# Patient Record
Sex: Female | Born: 1940 | Race: Black or African American | Hispanic: No | Marital: Single | Smoking: Never smoker
Health system: Southern US, Community
[De-identification: ages and names within clinical notes are randomized; demographics above are authoritative.]

---

## 1998-11-23 ENCOUNTER — Encounter: Payer: Self-pay | Admitting: Neurosurgery

## 1998-11-23 ENCOUNTER — Ambulatory Visit (HOSPITAL_COMMUNITY): Admission: RE | Admit: 1998-11-23 | Discharge: 1998-11-23 | Payer: Self-pay | Admitting: Neurosurgery

## 1998-12-05 ENCOUNTER — Encounter: Payer: Self-pay | Admitting: Neurosurgery

## 1998-12-05 ENCOUNTER — Ambulatory Visit (HOSPITAL_COMMUNITY): Admission: RE | Admit: 1998-12-05 | Discharge: 1998-12-05 | Payer: Self-pay | Admitting: Neurosurgery

## 1998-12-22 ENCOUNTER — Encounter: Payer: Self-pay | Admitting: Neurosurgery

## 1998-12-27 ENCOUNTER — Encounter: Payer: Self-pay | Admitting: Neurosurgery

## 1998-12-27 ENCOUNTER — Inpatient Hospital Stay (HOSPITAL_COMMUNITY): Admission: RE | Admit: 1998-12-27 | Discharge: 1999-01-04 | Payer: Self-pay | Admitting: Neurosurgery

## 1999-07-05 ENCOUNTER — Encounter: Admission: RE | Admit: 1999-07-05 | Discharge: 1999-07-05 | Payer: Self-pay | Admitting: Neurosurgery

## 1999-07-05 ENCOUNTER — Encounter: Payer: Self-pay | Admitting: Neurosurgery

## 1999-07-27 ENCOUNTER — Ambulatory Visit (HOSPITAL_COMMUNITY): Admission: RE | Admit: 1999-07-27 | Discharge: 1999-07-27 | Payer: Self-pay | Admitting: Neurosurgery

## 1999-07-27 ENCOUNTER — Encounter: Payer: Self-pay | Admitting: Neurosurgery

## 1999-10-05 ENCOUNTER — Ambulatory Visit (HOSPITAL_COMMUNITY): Admission: RE | Admit: 1999-10-05 | Discharge: 1999-10-05 | Payer: Self-pay | Admitting: Neurosurgery

## 1999-10-05 ENCOUNTER — Encounter: Payer: Self-pay | Admitting: Neurosurgery

## 1999-11-28 ENCOUNTER — Encounter: Payer: Self-pay | Admitting: Neurosurgery

## 1999-11-28 ENCOUNTER — Inpatient Hospital Stay (HOSPITAL_COMMUNITY): Admission: RE | Admit: 1999-11-28 | Discharge: 1999-12-04 | Payer: Self-pay | Admitting: Neurosurgery

## 2000-01-18 ENCOUNTER — Encounter: Admission: RE | Admit: 2000-01-18 | Discharge: 2000-01-18 | Payer: Self-pay | Admitting: Neurosurgery

## 2000-01-18 ENCOUNTER — Encounter: Payer: Self-pay | Admitting: Neurosurgery

## 2002-07-04 ENCOUNTER — Ambulatory Visit (HOSPITAL_COMMUNITY): Admission: RE | Admit: 2002-07-04 | Discharge: 2002-07-04 | Payer: Self-pay | Admitting: Neurosurgery

## 2002-07-04 ENCOUNTER — Encounter: Payer: Self-pay | Admitting: Neurosurgery

## 2003-10-12 ENCOUNTER — Ambulatory Visit (HOSPITAL_BASED_OUTPATIENT_CLINIC_OR_DEPARTMENT_OTHER): Admission: RE | Admit: 2003-10-12 | Discharge: 2003-10-12 | Payer: Self-pay | Admitting: Orthopaedic Surgery

## 2003-10-12 ENCOUNTER — Ambulatory Visit (HOSPITAL_COMMUNITY): Admission: RE | Admit: 2003-10-12 | Discharge: 2003-10-12 | Payer: Self-pay | Admitting: Orthopaedic Surgery

## 2004-05-22 ENCOUNTER — Ambulatory Visit (HOSPITAL_COMMUNITY): Admission: RE | Admit: 2004-05-22 | Discharge: 2004-05-22 | Payer: Self-pay | Admitting: Neurosurgery

## 2005-08-06 ENCOUNTER — Ambulatory Visit (HOSPITAL_COMMUNITY): Admission: RE | Admit: 2005-08-06 | Discharge: 2005-08-06 | Payer: Self-pay | Admitting: Neurosurgery

## 2005-09-20 ENCOUNTER — Ambulatory Visit: Admission: RE | Admit: 2005-09-20 | Discharge: 2005-09-20 | Payer: Self-pay | Admitting: Neurosurgery

## 2005-10-09 ENCOUNTER — Inpatient Hospital Stay (HOSPITAL_COMMUNITY): Admission: RE | Admit: 2005-10-09 | Discharge: 2005-10-13 | Payer: Self-pay | Admitting: Neurosurgery

## 2008-04-19 ENCOUNTER — Encounter: Admission: RE | Admit: 2008-04-19 | Discharge: 2008-04-19 | Payer: Self-pay | Admitting: Neurosurgery

## 2010-04-19 IMAGING — CT CT L SPINE W/O CM
4 of 10 series · 12 of 33 positions shown, 14 images · non-contrast
Comparison: Intraoperative films 10/09/2005 and preoperative MRI of
the lumbar spine 08/06/2005.

CLINICAL DATA: Status post L2-S1 fusion.  Increased left leg pain.
Multiple prior surgeries

CT LUMBAR SPINE WITHOUT CONTRAST
TECHNIQUE: Multidetector CT imaging of the lumbar spine was
performed without intravenous contrast administration. Multiplanar
CT image reconstructions were also generated.

[Series 2: l-spine helical · axial · 0.27mm/px · z∈[-14,+54]mm · 2 of 82 slices shown, 3 images]
[im 28/82  soft-tissue]
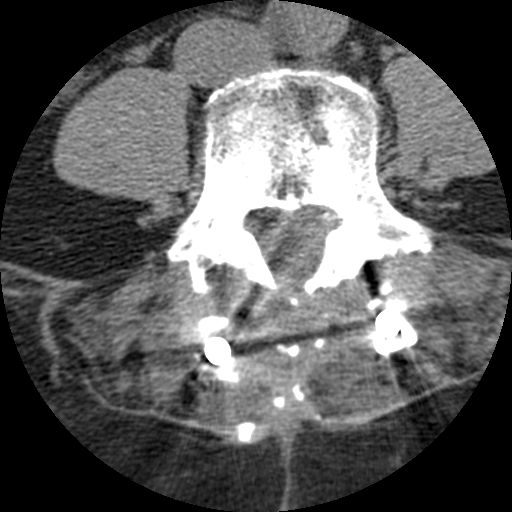
[im 28/82  bone]
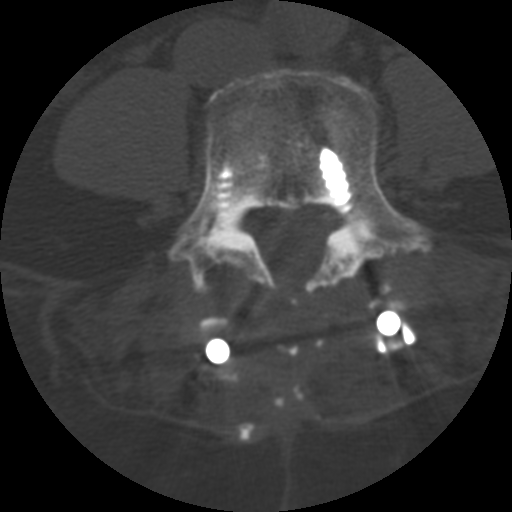
[im 55/82  bone]
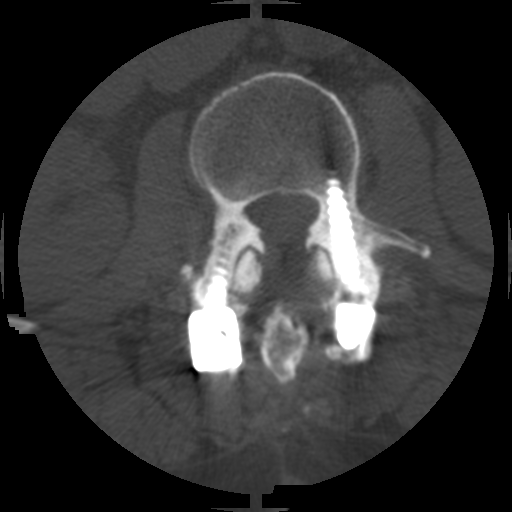

[Series 3: bone windows · axial · 0.27mm/px · z∈[-14,+54]mm · 2 of 82 slices shown]
[im 28/82  bone]
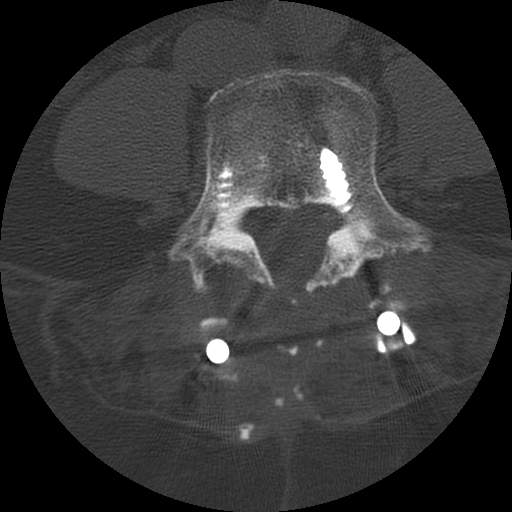
[im 55/82  bone]
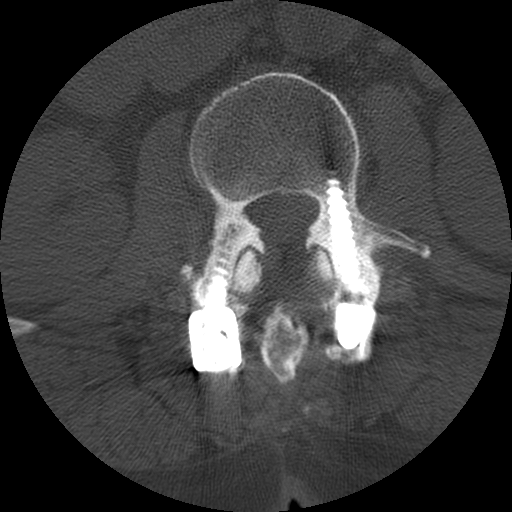

[Series 400: sag · sagittal · 0.41mm/px · 5 of 34 slices shown, 6 images]
[im 12/34  bone]
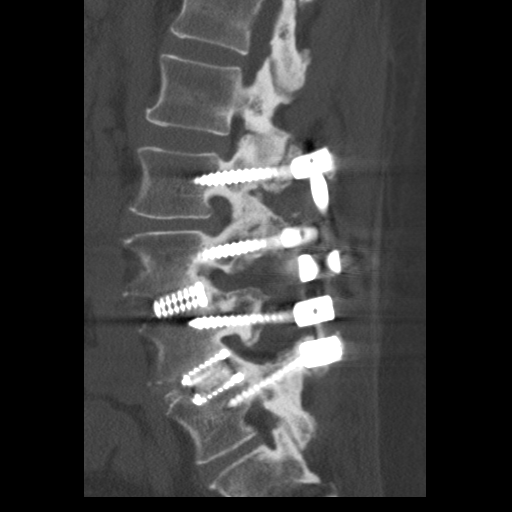
[im 14/34  bone]
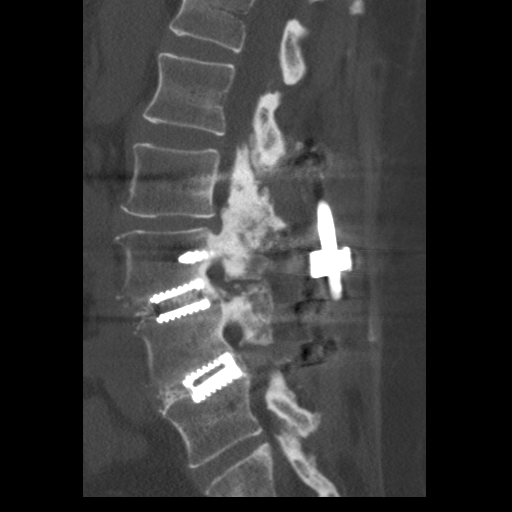
[im 17/34  soft-tissue]
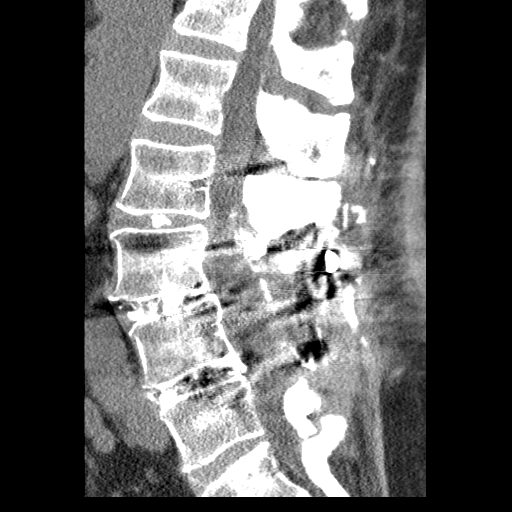
[im 17/34  bone]
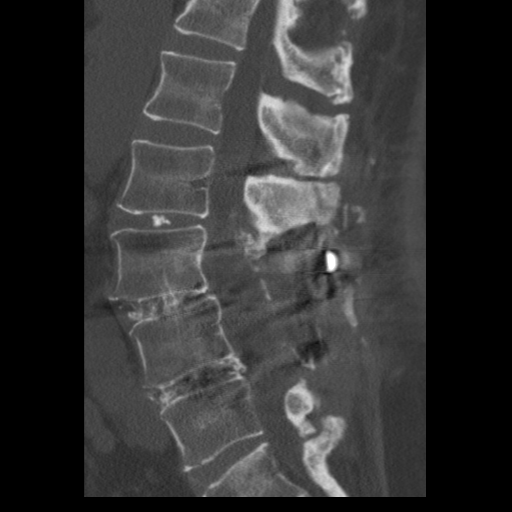
[im 20/34  bone]
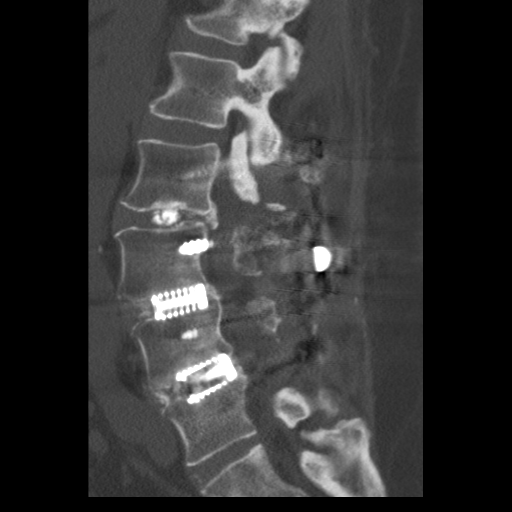
[im 23/34  bone]
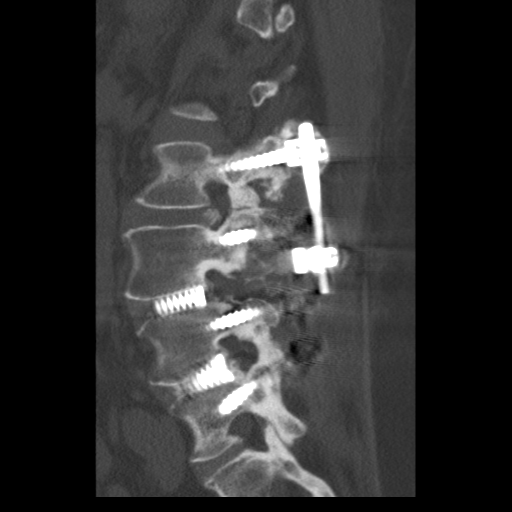

[Series 401: cor · coronal · 0.41mm/px · 3 of 34 slices shown]
[im 7/34  bone]
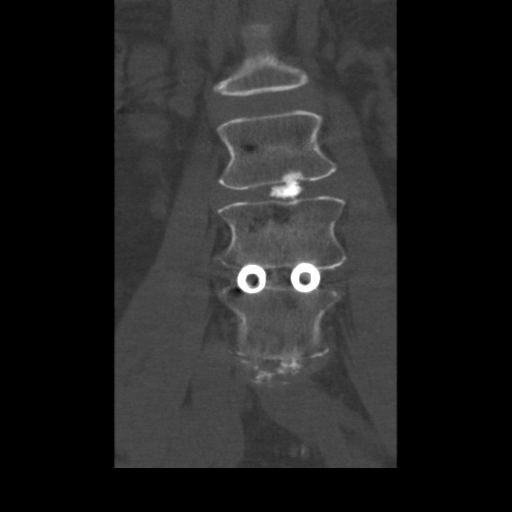
[im 14/34  bone]
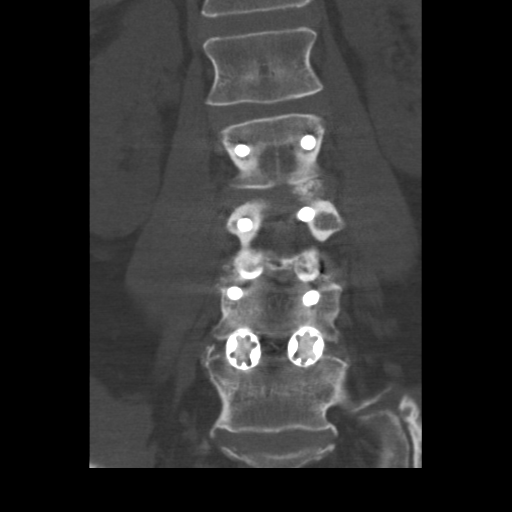
[im 20/34  bone]
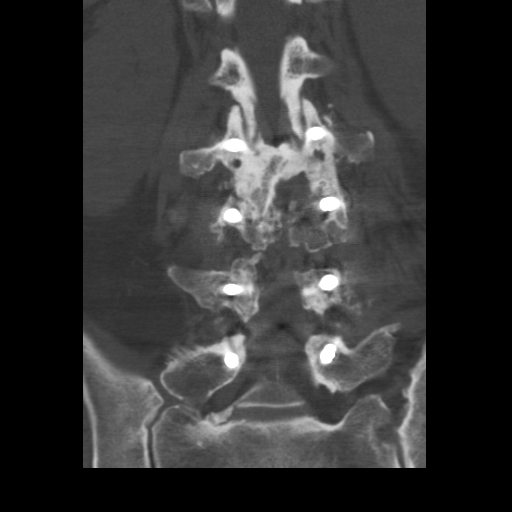

[12 of 33 positions shown; findings below may reference images not displayed]

FINDINGS: The lumbar spine is imaged from T12-S1.  Previous
laminectomy and PLIF L3-L5 with ray cages is redemonstrated.  There
is mature osseous fusion at the L3-4 and L4-5 levels.  There is
been interval extension of the fusion include L2-3.  A separate
pedicle screw and rod fixation is evident at the L2 level.  This is
then attached to the preexisting hardware from L3-L5.  The left
pedicle screw at L3 traverses the left lateral recess.  This could
potentially effect the L3 nerve root before it enters the foramen
or even the L4 nerve root.  Additionally, there is graft material
which extends posteriorly from the disc space on the left into the
epidural space and into the left neural foramen.  This creates
potential mass effect on the left L2 or L3 nerve roots.  No
definite incorporation of graft material is seen at the L2-3 level
within the disc space.  The L2-3 facet joints do appear fused.  A
left hemilaminotomy has been performed.  The right foramen is
patent.

Mild facet hypertrophy is present at L1-2 without significant
stenosis.  The T12-L1 level is unremarkable.

L2-3 is as described.

L3-4:  Residual facets create some encroachment posterior aspect of
the canal but the canal and foramina overall appear to be patent.

L4-5:  Laminectomy has been performed.  There is no focal stenosis.

L5-S1:  Mild facet hypertrophy is present.  There is no significant
stenosis.
IMPRESSION: 1.  Extension of fusion to include the L2-3 level.
2.  The graft material L2-3 is not incorporated into the bone.
3.  The posterior elements at L2-3 are fused.
4.  There is extrusion of graft material into the epidural space on
the left with potential mass effect on the L3 nerve root within the
lateral recess or the exiting L2 nerve root.  The
5.  Encroachment upon the lateral recess by the left pedicle screw
6.  Otherwise stable fusion L3-L5.

## 2010-08-05 ENCOUNTER — Encounter: Payer: Self-pay | Admitting: Neurosurgery

## 2010-12-01 NOTE — H&P (Signed)
[place]  Patient:    Elizabeth Madden, Elizabeth Madden                      MRN: [id number] Adm. Date:  11/28/99 Attending:  Payton Doughty, M.D.                         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  HISTORY OF PRESENT ILLNESS:  The patient is a 70 year old right handed black female who has undergone anterior decompression and fusion in 1993 and 1995 and underwent a decompressive laminectomy on September 17, 1994.  In November 1999, she was attacked by a dog and got knocked down.  She had increasing pain in her back.  This showed a bit of slip at L3-4 and degenerative changes at L2-3 and L4-5.  discography was positive at L3-4 and L4-5 and she underwent a Ray cage fusion.  She did well for several months, then had progressive increase in back pain.  Injection of the cages has demonstrated filling suggestive of nonunion.  She is admitted for augmentation of her fusion with pedicle fixation.  PAST MEDICAL HISTORY:  Remarkable for hysterectomy in 1977.  MEDICATIONS:  Xanax and Elavil.  SOCIAL HISTORY:  She does not smoke or drink.  She is on disability.  FAMILY HISTORY:  Noncontributory.  REVIEW OF SYSTEMS:  Unremarkable for bladder dysfunction.  PHYSICAL EXAMINATION:  HEENT:  Within normal limits.  NECK:  Good range of motion.  CHEST:  Clear.  CARDIAC:  Regular rate and rhythm.  ABDOMEN:  Nontender.  No hepatosplenomegaly.  EXTREMITIES:  Without clubbing or cyanosis.  GENITOURINARY:  Deferred.  PERIPHERAL PULSES:  Good.  NEUROLOGIC:  She is awake, alert and oriented.  Cranial nerves II-XII intact. Motor exam shows 5/5 strength throughout the upper and lower extremities save for dorsiflexors on the right side.  Knee jerks are diminished on the left. Ankle jerks are 1 bilaterally.  Straight leg raise is positive.  Forward flexion and extension of her back causes her to have back pain.  LABORATORY DATA:   Radiographic studies have been reviewed as above.  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  She is admitted now for augmentation with pedicle fixation.  The risks and benefits have been discussed with her and she wishes to proceed. DD:  11/28/99 TD:  11/28/99 Job: [id number]

## 2010-12-01 NOTE — Op Note (Signed)
[place]  Patient:    Elizabeth Madden, Elizabeth Madden                    MRN: [id number] Proc. Date: 11/28/99 Adm. 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  OPERATION PERFORMED:  L3-4, L4-5 pedicle fixation with spiral 90 system.  SURGEON:  Payton Doughty, M.D.  ANESTHESIA:  General endotracheal.  PREP:  Sterile Betadine prep and scrub with alcohol wipe.  COMPLICATIONS:  None.  DESCRIPTION OF PROCEDURE:  The patient is a 70 year old right-handed black female with pseudarthrosis after interbody fusion a year ago at 3-4 and 4-5. The patient was taken to the operating room, smoothly anesthetized and intubated, and placed prone on the operating table.  Following shave, prep and drape in the usual sterile fashion the old skin incision was infiltrated with 1% lidocaine with 1:400,000 epinephrine.  The skin incision was reopened from the top of L2 to the mid-S1.  The lamina of L2, L3, L4 and L5 were exposed bilaterally in the subperiosteal plane.  The transverse processes of L3, L4 and L5 were exposed and using these as landmarks, the pedicles of L3, L4 and L5 were identified.  The transverse processes were uncovered and decorticated and prepared for bone grafting.  Using standard landmarks then, pedicle screws were placed first on the right side, then on the left side at L5, L4 and L3. Intraoperative x-ray showed good placement of pedicle screws.  Rods were then placed across the pedicle screws and locked into place.  Intraoperative x-ray showed good placement of screws and rods.  The transverse processes were then covered with Osteoblast BMP with bone chips.  The entire area was irrigated, hemostasis assured, the fascia was reapproximated with 0 Vicryl in interrupted fashion and subcutaneous  tissues reapproximated with 0 Vicryl in interrupted fashion.  The subcuticular tissues were reapproximated with 3-0 Vicryl in interrupted fashion.  The skin was closed with 3-0 nylon in a running locked fashion.  Betadine Telfa dressing was applied and made occlusive with Op-Site. The patient then returned to the recovery room in good position. DD:  11/28/99 TD:  11/30/99 Job: [id number]

## 2010-12-01 NOTE — Op Note (Signed)
NAME:  Elizabeth Madden, Elizabeth Madden                       ACCOUNT NO.:  [id number]   MEDICAL RECORD NO.:  [id number]                   PATIENT TYPE:  AMB   LOCATION:  DSC                                  FACILITY:  [facility]   PHYSICIAN:  Claude Manges. Cleophas Dunker, M.D.            DATE OF BIRTH:  1940-10-15   DATE OF PROCEDURE:  10/12/2003  DATE OF DISCHARGE:                                 OPERATIVE REPORT   PREOPERATIVE DIAGNOSIS:  Impingement right shoulder with degenerative joint  disease acromioclavicular joint.   POSTOPERATIVE DIAGNOSIS:  Impingement right shoulder with degenerative joint  disease acromioclavicular joint with partial rotator cuff tear.   PROCEDURE:  1. Arthroscopic debridement right shoulder joint.  2. Arthroscopic subacromial decompression.  3. Open distal clavicle resection.   SURGEON:  Claude Manges. Cleophas Dunker, M.D.   ASSISTANT:  Richardean Canal, P.A.-C   ANESTHESIA:  General endotracheal anesthesia with supplemental interscalene  nerve block.   COMPLICATIONS:  None.   HISTORY:  This 70 year old female has been followed since January 2004 for  problems referable to her right shoulder.  She has had an MRI revealing  partial rotator cuff tear with some degenerative changes at the [facility] joint.  She has had approximately 4-5 cortisone injections with temporary relief of  her pain. She does have clinical evidence of impingement, but without  evidence of adhesive capsulitis.  She is now to have an arthroscopic  evaluation.   DESCRIPTION OF PROCEDURE:  With the patient comfortable on the operating  room table under general endotracheal anesthesia the patient was placed in a  semi-sitting position.  She did have a preoperative interscalene nerve block  for __________ pain control.  With the patient in a semi-sitting position  the right shoulder was then prepped with duraprep and the patient had  __________ below the elbow, sterile draping was performed.   A marking pen was used to  outline the [facility] joint, the coracoid and the  acromion.  At a point about a fingerbreadth posterior and inferior to the  posterior angle of the acromion a small stab wound was made.  The  arthroscope was then easily placed into the shoulder joint.  Diagnostic  arthroscopy did reveal a partial rotator cuff tear of the supraspinatus.  A  second portal was established anteriorly and debridement of that was  performed.   There was moderate synovitis of the joint which was debrided with the  ArthroCare 1 though I did not see any appreciable chondromalacia of the  humeral head or glenoid.  There were no loose bodies.  The biceps tendon  appeared to be intact as did the anterior glenoid labrum.   The arthroscope was then placed in the subacromial space posteriorly.  The  third portal was established in the lateral subacromial space and an  arthroscopic subacromial decompression was performed.  There was obvious  overhang and thickening of the acromion; had a very nice decompression with  the  6-mm bur such at that there was no further impingement.  I did not see  any evidence of rotator cuff tear.   Distal clavicle resection was performed through a 1-inch incision; about  approximately 1-cm of the distal clavicle was resected.  With finger  exploration, I felt that I had an excellent decompression of both the  anterior acromion and the distal clavicle.  The wound was irrigated with  saline solution.  The [facility] joint capsule was closed with interrupted #0  Vicryl; the subcu with 2-0 Vicryl; skin closed with skin clips.  Sterile  bulky dressing was applied.   The patient tolerated the procedure without complications.   PLAN:  Possible recovery care otherwise discharge on Percocet.                                               Claude Manges. Cleophas Dunker, M.D.    PWW/MEDQ  D:  10/12/2003  T:  10/12/2003  Job:  [id number]

## 2010-12-01 NOTE — Discharge Summary (Signed)
[place]. [facility]  Patient:    Elizabeth Madden, Elizabeth Madden                    MRN: [id number] Adm. Date:  [id number] Disc. Date: [id number] Attending:  Emeterio Reeve                           Discharge Summary  ADMITTING DIAGNOSIS:  Nonunion at L3-4, L4-5.  DISCHARGE DIAGNOSIS:  Nonunion at L3-4, L4-5.  OPERATIVE PROCEDURE:  L3-4, L4-5 fusion and augmentation with pedicle screws.  SERVICE:  Neurosurgery.  COMPLICATIONS:  None.  DISCHARGE STATUS:  Alive and well.  HISTORY:  This is a 70 year old right-handed black lady whose history and physical is recounted in the chart.  She had undergone a fusion a year ago at 3-4 and 4-5 and developed a nonunion.  She was admitted for fixation with pedicle screws.  MEDICAL HISTORY:  Remarkable for hysterectomy in 1977.  MEDICATIONS:  Xanax, Elavil and Percocet.  PHYSICAL EXAMINATION:  General exam was unremarkable save for back pain with motion.  Neurologic exam was intact.  HOSPITAL COURSE:  She was admitted after ascertainment of normal laboratory values and underwent pedicle fixation at L3-4, L4-5.  Postoperatively, she has done well, somewhat slow to mobilize but is up and about now and participating in physical therapy.  Foley was removed after the second postoperative day. PCA was stopped after the third postoperative day.  With physical therapy, she is now up and about and facile with her activities of daily living.  She had a bout of constipation which was relieved with laxatives and she is afebrile with a well-healing incision and intact strength.  DISCHARGE MEDICATIONS:  She is being discharged home with Percocet for pain.  FOLLOWUP:  Her followup will be in the [facility] Office in about a week for suture removal. DD:  12/04/99 TD:  12/06/99 Job: [id number]

## 2010-12-01 NOTE — H&P (Signed)
Elizabeth Madden, Elizabeth Madden             ACCOUNT NO.:  [id number]   MEDICAL RECORD NO.:  [id number]          PATIENT TYPE:  OIB   LOCATION:  3007                         FACILITY:  [facility]   PHYSICIAN:  Payton Doughty, M.D.      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   SERVICE:  Neurosurgery   This is a now 70 year old right-handed black lady who has had a fusion at L3-  4 and L4-5.  She has had increased pain in her back.  Obtained an MR that  demonstrates degenerative change at 2-3 and significant canal stenosis and  she is now admitted for extension of her fusion up to 2-3.   MEDICAL HISTORY:  Remarkable for a bit of hypertension.   PAST SURGICAL HISTORY:  Remarkable for anterior decompression and fusion in  1993 at 5-6 and 6-7, and a lumbar fusion at 3-4 and 4-5 and she did have a  pseudoarthrosis at that level.   FAMILY HISTORY:  Not given.   PHYSICAL EXAMINATION:  HEENT:  Within normal limits.  She has reasonable  range of motion of her neck.  CHEST:  Clear.  CARDIAC:  Regular rate and rhythm.  ABDOMEN:  Nontender with no hepatosplenomegaly.  EXTREMITIES:  Without clubbing or cyanosis.  GENITOURINARY:  Deferred.  PERIPHERAL PULSES:  Good.  NEUROLOGIC:  She is awake, alert, and oriented.  Cranial nerves are intact.  Motor exam shows 5/5 strength throughout the upper and lower extremities.  There is no current sensory deficit.  Reflexes are absent at the knees and  ankles.  Straight leg raise is bilaterally positive.   MRI results have been reviewed above.   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   The plan is for placing Ray cages at L2-3 and extend the rods up to L2-3.  I  think this will be able to be done with a side rail device rather than  exposing the entire fusion.  The risks and benefits of this approach have  been discussed with her and she wishes to proceed.          ______________________________  Payton Doughty, M.D.    MWR/MEDQ  D:  10/09/2005  T:  10/10/2005  Job:  [id number]

## 2010-12-01 NOTE — Discharge Summary (Signed)
Elizabeth Madden, Elizabeth Madden             ACCOUNT NO.:  [id number]   MEDICAL RECORD NO.:  [id number]          PATIENT TYPE:  INP   LOCATION:  3007                         FACILITY:  [facility]   PHYSICIAN:  Payton Doughty, M.D.      DATE OF BIRTH:  07-08-1941   DATE OF ADMISSION:  10/09/2005  DATE OF DISCHARGE:  10/13/2005                                 DISCHARGE SUMMARY   ADMISSION DIAGNOSIS:  Spondylosis at lumbar vertebrae-2/3.   DISCHARGE DIAGNOSIS:  Spondylosis at lumbar vertebrae-2/3.   OPERATIVE PROCEDURE:  L2/3 laminectomy and discectomy, posterior lumbar  interbody fusion with bone graft and BNP and nonsegmental pedicle screws  from L2 to L3, posterolateral arthrodesis at L2/3.   COMPLICATIONS:  None.   A 70 year old girl whose history and physical is recounted on the chart.  She has had fusions at C3/4 and C4/5.  Now has spondylosis at C2/3.  She was  admitted after obtaining normal laboratory values and underwent fusion as  noted above.   Postoperatively, she has done relatively well.  First postoperative day, she  was up out of bed.  The second postoperative day, she participated in  physical therapy.  Foley catheter was removed.  PCA was stopped yesterday.  She is on oral medications currently.  Her incision is dry and well healing  and she is walking well.  Discharge will be to her home with Percocet for  pain and a five-day course of ciprofloxacin.  She will be followed up in the  [facility] office in about 10 days for sutures.           ______________________________  Payton Doughty, M.D.     MWR/MEDQ  D:  10/13/2005  T:  10/16/2005  Job:  [id number]

## 2010-12-01 NOTE — Op Note (Signed)
Elizabeth Madden, Elizabeth Madden             ACCOUNT NO.:  [id number]   MEDICAL RECORD NO.:  [id number]          PATIENT TYPE:  INP   LOCATION:  3007                         FACILITY:  [facility]   PHYSICIAN:  Payton Doughty, M.D.      DATE OF BIRTH:  August 31, 1940   DATE OF PROCEDURE:  10/09/2005  DATE OF DISCHARGE:                                 OPERATIVE REPORT   PREOPERATIVE DIAGNOSIS:  Spondylosis L2-L3.   POSTOPERATIVE DIAGNOSIS:  Spondylosis L2-L3.   OPERATIVE PROCEDURE:  L2-L3 laminectomy, discectomy, posterior lumbar  interbody fusion with BMP, nonsegmental pedicle screws from L2 to L3, and  posterolateral arthrodesis L2-L3.   SURGEON:  Payton Doughty, M.D.   SERVICE:  Neurosurgery   ANESTHESIA:  General endotracheal anesthesia.   PREPARATION:  Betadine and alcohol wipe.   COMPLICATIONS:  None.   ASSISTANT:  Nurse assistant [place]   BODY OF TEXT:  This is a 70 year old lady with severe spondylosis at L2-L3  above a fusion that goes from L3 to L5.  She is taken to the operating room,  smoothly anesthetized and intubated, placed prone on the operating table.  Following shave, prep, and drape in the usual sterile fashion, the skin was  infiltrated with 1% lidocaine with 1:400,000 epinephrine.  The skin was  incised from the top of L1 to the bottom of L2.  The lamina and transverse  processes of L2 and L3 were exposed bilaterally in a subperiosteal plane.  Interoperative x-ray confirmed correct level.  Having confirmed the correct  level, on the left side, the pars intra-articularis, remaining lamina, and  inferior facet of L2 and superior facet of L3, as much as could be removed,  was taken.  This allowed decompression of the left L4 root and the neural  foramen.  On the right side, a laminotomy and foraminotomy was carried out  and included part of the superior face of L3.  This allowed decompression of  the L2 root as it traversed the area and decompression of the dorsal root  ganglion.  Pedicle screws were then placed in L2 bilaterally under x-ray  guidance.  The side by side rod attachment device was then placed on the pre-  existing rod that extended from L3 to L4 and it was capped on.  The new rod  was contoured and fit from the connector up to the pedicle screws that had  been placed in L2.  They were locked down.  There was a very solid  construct.  The discectomy was carried out at L2-L3 and the disc space was  packed with BNP on the extender matrix.  The transverse processes were  decorticated with a high speed drill and BNP on the extender matrix was  placed between them.  Interoperative x-ray showed good placement  of the pedicle screws and attaching rods.  Successive layers of 0 Vicryl, 2-  0 Vicryl, and 3-0 nylon were used to close the fascia, subcutaneous tissue,  and skin respectively.  Betadine and Telfa dressing was applied and the  patient returned to the recovery room in good condition.  ______________________________  Payton Doughty, M.D.     MWR/MEDQ  D:  10/09/2005  T:  10/10/2005  Job:  [id number]

## 2012-11-03 ENCOUNTER — Emergency Department (HOSPITAL_COMMUNITY)
Admission: EM | Admit: 2012-11-03 | Discharge: 2012-11-04 | Discharge status: Against Medical Advice | Payer: Self-pay | Attending: Emergency Medicine | Admitting: Emergency Medicine

## 2012-11-03 ENCOUNTER — Encounter (HOSPITAL_COMMUNITY): Payer: Self-pay | Admitting: Emergency Medicine

## 2012-11-03 DIAGNOSIS — R109 Unspecified abdominal pain: Secondary | ICD-10-CM | POA: Insufficient documentation

## 2012-11-03 DIAGNOSIS — R42 Dizziness and giddiness: Secondary | ICD-10-CM | POA: Insufficient documentation

## 2012-11-03 DIAGNOSIS — R112 Nausea with vomiting, unspecified: Secondary | ICD-10-CM | POA: Insufficient documentation

## 2021-09-15 ENCOUNTER — Other Ambulatory Visit: Payer: Self-pay | Admitting: Internal Medicine

## 2021-09-15 DIAGNOSIS — Z139 Encounter for screening, unspecified: Secondary | ICD-10-CM

## 2021-09-27 ENCOUNTER — Ambulatory Visit: Admission: RE | Admit: 2021-09-27 | Payer: Self-pay | Source: Ambulatory Visit

## 2021-09-27 ENCOUNTER — Other Ambulatory Visit: Payer: Self-pay

## 2023-01-14 ENCOUNTER — Telehealth: Payer: Self-pay | Admitting: Nutrition

## 2023-01-14 NOTE — Telephone Encounter (Signed)
l °

## 2023-01-29 NOTE — Telephone Encounter (Signed)
Pt. To call me when she gets her reader to set this up and show how to use the new sensors
